# Patient Record
Sex: Female | Born: 1995 | Race: Black or African American | Hispanic: No | Marital: Married | Smoking: Never smoker
Health system: Southern US, Community
[De-identification: ages and names within clinical notes are randomized; demographics above are authoritative.]

## PROBLEM LIST (undated history)

## (undated) ENCOUNTER — Emergency Department (HOSPITAL_BASED_OUTPATIENT_CLINIC_OR_DEPARTMENT_OTHER): Admission: EM | Payer: BLUE CROSS/BLUE SHIELD | Source: Home / Self Care

## (undated) DIAGNOSIS — K219 Gastro-esophageal reflux disease without esophagitis: Secondary | ICD-10-CM

## (undated) DIAGNOSIS — I10 Essential (primary) hypertension: Secondary | ICD-10-CM

---
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
## 2020-12-06 ENCOUNTER — Ambulatory Visit: Payer: 59 | Admitting: Obstetrics & Gynecology

## 2020-12-26 ENCOUNTER — Other Ambulatory Visit: Payer: Self-pay | Admitting: Surgery

## 2020-12-26 DIAGNOSIS — R6889 Other general symptoms and signs: Secondary | ICD-10-CM

## 2021-01-22 ENCOUNTER — Other Ambulatory Visit: Payer: Self-pay

## 2021-01-24 ENCOUNTER — Ambulatory Visit: Payer: Self-pay | Admitting: Medical

## 2021-01-31 ENCOUNTER — Other Ambulatory Visit: Payer: Self-pay

## 2021-02-05 ENCOUNTER — Ambulatory Visit: Payer: Self-pay | Admitting: Obstetrics and Gynecology

## 2021-02-05 ENCOUNTER — Encounter: Payer: Self-pay | Admitting: Obstetrics and Gynecology

## 2021-02-06 NOTE — Progress Notes (Signed)
Patient did not keep her new GYN referral appointment for 02/05/2021.  Cornelia Copa MD Attending Center for [employer] [facility])

## 2022-01-15 ENCOUNTER — Other Ambulatory Visit: Payer: Self-pay

## 2022-01-15 ENCOUNTER — Emergency Department (HOSPITAL_COMMUNITY)
Admission: EM | Admit: 2022-01-15 | Discharge: 2022-01-15 | Disposition: A | Discharge status: Home / Self Care | Payer: 59 | Attending: Emergency Medicine | Admitting: Emergency Medicine

## 2022-01-15 ENCOUNTER — Encounter (HOSPITAL_COMMUNITY): Payer: Self-pay | Admitting: Pharmacy Technician

## 2022-01-15 DIAGNOSIS — E669 Obesity, unspecified: Secondary | ICD-10-CM | POA: Diagnosis not present

## 2022-01-15 DIAGNOSIS — R1905 Periumbilic swelling, mass or lump: Secondary | ICD-10-CM | POA: Insufficient documentation

## 2022-01-15 NOTE — Discharge Instructions (Signed)
Follow-up with your primary care provider  Return for new or worsening symptoms 

## 2022-01-15 NOTE — ED Triage Notes (Signed)
Pt here POV with reports of knot at the top of her umbilicus. Pt denies pain to area.  ?

## 2022-01-15 NOTE — ED Provider Notes (Signed)
?[facility] ?Provider Note ? ? ?CSN: [id number] ?Arrival date & time: 01/15/22  1332 ? ?  ?History ? ?Possible umbilical mass ? ?Lindsey PacasCynthia M Mora is a 26 y.o. female with no significant past medical history here for evaluation of possible mass in her umbilical area.  Patient states areas been there for 1-1/2 to 2 years.  Seen by PCP for this in January.  Thought was benign per patient. She took some antibiotics for some slight oozing to area and her symptoms resolved. "Looks like a pimple." She has no current symptoms however wanted it "checked out."  No prior abdominal surgeries, no history of hernias.  No fever, abdominal pain, redness, warmth, fluctuance, induration, abdominal masses. Passing flatus with normal BM. ? ?HPI ? ?  ? ?Home Medications ?Prior to Admission medications   ?Medication Sig Start Date End Date Taking? Authorizing Provider  ?acetaminophen (TYLENOL) 500 MG tablet Take 1,000 mg by mouth daily as needed for mild pain.   Yes [provider]  ?ibuprofen (ADVIL) 200 MG tablet Take 400 mg by mouth daily as needed for mild pain.   Yes [provider]  ?Multiple Vitamins-Minerals (EMERGEN-C VITAMIN C PO) Take 1 packet by mouth daily.   Yes [provider]  ?   ? ?Allergies    ?Patient has no active allergies.   ? ?Review of Systems   ?Review of Systems  ?Constitutional: Negative.   ?HENT: Negative.    ?Respiratory: Negative.    ?Cardiovascular: Negative.   ?Gastrointestinal:   ?     Abd mass  ?Genitourinary: Negative.   ?Musculoskeletal: Negative.   ?Neurological: Negative.   ?All other systems reviewed and are negative. ? ?Physical Exam ?Updated Vital Signs ?BP (!) 143/97 (BP Location: Right Arm)   Pulse 94   Temp 98.9 ?F (37.2 ?C) (Oral)   Resp 18   SpO2 99%  ?Physical Exam ?Vitals and nursing note reviewed.  ?Constitutional:   ?   General: She is not in acute distress. ?   Appearance: She is well-developed. She is obese. She is not  ill-appearing, toxic-appearing or diaphoretic.  ?HENT:  ?   Head: Normocephalic and atraumatic.  ?   Nose: Nose normal.  ?   Mouth/Throat:  ?   Mouth: Mucous membranes are moist.  ?Eyes:  ?   Pupils: Pupils are equal, round, and reactive to light.  ?Cardiovascular:  ?   Rate and Rhythm: Normal rate.  ?   Pulses: Normal pulses.  ?Pulmonary:  ?   Effort: Pulmonary effort is normal. No respiratory distress.  ?   Breath sounds: Normal breath sounds.  ?Abdominal:  ?   General: Bowel sounds are normal. There is no distension.  ?   Palpations: Abdomen is soft.  ?   Tenderness: There is no abdominal tenderness. There is no right CVA tenderness, left CVA tenderness, guarding or rebound.  ?   Hernia: No hernia is present.  ?   Comments: Difficult exam secondary to body habitus.  Abdomen soft, nontender.  She has no obvious hernia, specifically umbilical hernia.  On the very anterior superior aspect of her umbilicus patient has what appears to be blackhead. No fluctuance, induration, abscess.  No overlying erythema, warmth.  ?Musculoskeletal:     ?   General: Normal range of motion.  ?   Cervical back: Normal range of motion.  ?Skin: ?   General: Skin is warm and dry.  ?   Capillary Refill: Capillary refill takes  less than 2 seconds.  ?Neurological:  ?   General: No focal deficit present.  ?   Mental Status: She is alert.  ?Psychiatric:     ?   Mood and Affect: Mood normal.  ? ? ?ED Results / Procedures / Treatments   ?Labs ?(all labs ordered are listed, but only abnormal results are displayed) ?Labs Reviewed - No data to display ? ?EKG ?None ? ?Radiology ?No results found. ? ?Procedures ?Procedures  ? ? ?Medications Ordered in ED ?Medications - No data to display ? ?ED Course/ Medical Decision Making/ A&P ?  ? ?26 year old here for evaluation of possible mass on the inside superior aspect umbilicus.  Mass has been present for approx 1.5-2 years. Unchanged per patient  She is afebrile, nonseptic, not ill-appearing.  Was seen  by PCP in January who did not think this was mush.  Patient states was a "pimple" like lesion and took leftover abx from a friend, symptoms resolved.  She came here today for further evaluation.  Her abdomen is soft, nontender.  She has no obvious hernia, abscess, cellulitis on exam.  She does have approximately 4 mm area what appears to be a blackhead on the inside of her umbilicus.  No obvious drainage.  No systemic symptoms. ? ?I discussed the exam with patient.  At this time I low suspicion for hernia, abscess, infectious process, acute intra-abdominal process.  I did offer labs, CT imaging to get better imaging given body habitus, patient declined and states she does not have time for this today. I encouraged FU with PCP for return if her sx return however given she is currently asymptomatic and reassuring exam with sx for over 1 year I feel this is reasonable to FU outpatient. ? ?Patient does not meet the SIRS or Sepsis criteria.  On repeat exam patient does not have a surgical abdomin and there are no peritoneal signs.  No indication of appendicitis, bowel obstruction, bowel perforation, cholecystitis, diverticulitis, PID or ectopic pregnancy.   ?  ?The patient has been appropriately medically screened and/or stabilized in the ED. I have low suspicion for any other emergent medical condition which would require further screening, evaluation or treatment in the ED or require inpatient management. ? ?Patient is hemodynamically stable and in no acute distress.  Patient able to ambulate in department prior to ED.  Evaluation does not show acute pathology that would require ongoing or additional emergent interventions while in the emergency department or further inpatient treatment.  I have discussed the diagnosis with the patient and answered all questions.  Pain is been managed while in the emergency department and patient has no further complaints prior to discharge.  Patient is comfortable with plan discussed  in room and is stable for discharge at this time.  I have discussed strict return precautions for returning to the emergency department.  Patient was encouraged to follow-up with PCP/specialist refer to at discharge.  ? ?                        ?Medical Decision Making ?Amount and/or Complexity of Data Reviewed ?External Data Reviewed: labs, radiology and notes. ? ?Risk ?OTC drugs. ?Prescription drug management. ?Diagnosis or treatment significantly limited by social determinants of health. ? ? ? ? ? ? ? ? ?Final Clinical Impression(s) / ED Diagnoses ?Final diagnoses:  ?Periumbilical mass  ? ? ?Rx / DC Orders ?ED Discharge Orders   ? ? None  ? ?  ? ? ?  ?  Modesta Sammons A, PA-C ?01/15/22 1554 ? ?  ?Melene Plan, DO ?01/15/22 1901 ? ?

## 2022-01-15 NOTE — ED Provider Triage Note (Signed)
Emergency Medicine Provider Triage Evaluation Note ? ?Lindsey Mora , a 26 y.o. female  was evaluated in triage.  Pt complains of possible abscess/cyst in umbilicus.  Patient states that for the past year or so she has had a lesion into the top of her "bellybutton "that has occasionally drained purulent material.  She has been prescribed antibiotics in the past but felt it did not help.  She recently traveled to visit her aunt and took antibiotics that her aunt has leftover and felt that they helped.  No drainage at this time.  ? ?Review of Systems  ?Positive: Abscess/cyst ?Negative: Abdominal pain ? ?Physical Exam  ?BP (!) 143/97 (BP Location: Right Arm)   Pulse 94   Temp 98.9 ?F (37.2 ?C) (Oral)   Resp 18   SpO2 99%  ?Gen:   Awake, no distress   ?Resp:  Normal effort  ?MSK:   Moves extremities without difficulty  ?Other:  No obvious lesion ? ?Medical Decision Making  ?Medically screening exam initiated at 1:48 PM.  Appropriate orders placed.  CATHY CROUNSE was informed that the remainder of the evaluation will be completed by another provider, this initial triage assessment does not replace that evaluation, and the importance of remaining in the ED until their evaluation is complete. ? ? ?  ?Darrick Grinder, PA-C ?01/15/22 1349 ? ?

## 2022-01-20 ENCOUNTER — Ambulatory Visit (HOSPITAL_COMMUNITY)
Admission: EM | Admit: 2022-01-20 | Discharge: 2022-01-20 | Disposition: A | Discharge status: Home / Self Care | Payer: 59 | Attending: Emergency Medicine | Admitting: Emergency Medicine

## 2022-01-20 ENCOUNTER — Encounter (HOSPITAL_COMMUNITY): Payer: Self-pay | Admitting: Emergency Medicine

## 2022-01-20 DIAGNOSIS — Z202 Contact with and (suspected) exposure to infections with a predominantly sexual mode of transmission: Secondary | ICD-10-CM

## 2022-01-20 DIAGNOSIS — Z113 Encounter for screening for infections with a predominantly sexual mode of transmission: Secondary | ICD-10-CM | POA: Diagnosis present

## 2022-01-20 LAB — HIV ANTIBODY (ROUTINE TESTING W REFLEX): HIV Screen 4th Generation wRfx: NONREACTIVE

## 2022-01-20 NOTE — ED Triage Notes (Signed)
Pt is present today with concerns for STD. Pt states that she would also like to be tested for HIV and syphilis  ?

## 2022-01-20 NOTE — Discharge Instructions (Signed)
You will get a call if tests are positive, you will not get a call if tests are negative but you can check results in MyChart if you have a MyChart account.   

## 2022-01-20 NOTE — ED Provider Notes (Signed)
?[facility] ? ? ? ?CSN: [id number] ?Arrival date & time: 01/20/22  1234 ? ? ?  ? ?History   ?Chief Complaint ?Chief Complaint  ?Patient presents with  ? SEXUALLY TRANSMITTED DISEASE  ? ? ?HPI ?Lindsey Mora is a 26 y.o. female.  She is here requesting STI testing including syphilis and HIV.  Patient reports she has not been sexually active for 3 years but after her previous relationship and did not, never underwent STI screening.  Denies any active symptoms.  Last menstrual period was 2 months ago patient reports that is not unusual for her and they are always irregular.  Patient denies sexual activity for 3 years ? ?HPI ? ?History reviewed. No pertinent past medical history. ? ?There are no problems to display for this patient. ? ? ?History reviewed. No pertinent surgical history. ? ?OB History   ?No obstetric history on file. ?  ? ? ? ?Home Medications   ? ?Prior to Admission medications   ?Medication Sig Start Date End Date Taking? Authorizing Provider  ?acetaminophen (TYLENOL) 500 MG tablet Take 1,000 mg by mouth daily as needed for mild pain.    [provider]  ?ibuprofen (ADVIL) 200 MG tablet Take 400 mg by mouth daily as needed for mild pain.    [provider]  ?Multiple Vitamins-Minerals (EMERGEN-C VITAMIN C PO) Take 1 packet by mouth daily.    [provider]  ? ? ?Family History ?History reviewed. No pertinent family history. ? ?Social History ?Social History  ? ?Tobacco Use  ? Smoking status: Never  ?Substance Use Topics  ? Alcohol use: No  ? Drug use: No  ? ? ? ?Allergies   ?Patient has no known allergies. ? ? ?Review of Systems ?Review of Systems  ?Gastrointestinal:  Negative for abdominal pain, nausea and vomiting.  ?Genitourinary:  Negative for dysuria, flank pain, genital sores, hematuria, vaginal bleeding and vaginal discharge.  ? ? ?Physical Exam ?Triage Vital Signs ?ED Triage Vitals  ?Enc Vitals Group  ?   BP 01/20/22 1326 135/82  ?   Pulse Rate 01/20/22  1326 96  ?   Resp 01/20/22 1326 18  ?   Temp 01/20/22 1326 98.7 ?F (37.1 ?C)  ?   Temp Source 01/20/22 1326 Oral  ?   SpO2 01/20/22 1326 98 %  ?   Weight --   ?   Height --   ?   Head Circumference --   ?   Peak Flow --   ?   Pain Score 01/20/22 1325 0  ?   Pain Loc --   ?   Pain Edu? --   ?   Excl. in GC? --   ? ?No data found. ? ?Updated Vital Signs ?BP 135/82   Pulse 96   Temp 98.7 ?F (37.1 ?C) (Oral)   Resp 18   LMP 12/01/2021 (Approximate)   SpO2 98%  ? ?Visual Acuity ?Right Eye Distance:   ?Left Eye Distance:   ?Bilateral Distance:   ? ?Right Eye Near:   ?Left Eye Near:    ?Bilateral Near:    ? ?Physical Exam ?Constitutional:   ?   General: She is not in acute distress. ?   Appearance: Normal appearance. She is not ill-appearing.  ?Pulmonary:  ?   Effort: Pulmonary effort is normal.  ?Neurological:  ?   Mental Status: She is alert.  ? ? ? ?UC Treatments / Results  ?Labs ?(all labs ordered are listed, but only  abnormal results are displayed) ?Labs Reviewed  ?RPR  ?HIV ANTIBODY (ROUTINE TESTING W REFLEX)  ?CERVICOVAGINAL ANCILLARY ONLY  ? ? ?EKG ? ? ?Radiology ?No results found. ? ?Procedures ?Procedures (including critical care time) ? ?Medications Ordered in UC ?Medications - No data to display ? ?Initial Impression / Assessment and Plan / UC Course  ?I have reviewed the triage vital signs and the nursing notes. ? ?Pertinent labs & imaging results that were available during my care of the patient were reviewed by me and considered in my medical decision making (see chart for details). ? ?  ?STI testing as requested. ? ?Final Clinical Impressions(s) / UC Diagnoses  ? ?Final diagnoses:  ?Routine screening for STI (sexually transmitted infection)  ? ? ? ?Discharge Instructions   ? ?  ?You will get a call if tests are positive, you will not get a call if tests are negative but you can check results in MyChart if you have a MyChart account.  ? ? ? ?ED Prescriptions   ?None ?  ? ?PDMP not reviewed this  encounter. ?  ?Cathlyn Parsons, NP ?01/20/22 1416 ? ?

## 2022-01-21 ENCOUNTER — Telehealth (HOSPITAL_COMMUNITY): Payer: Self-pay | Admitting: Emergency Medicine

## 2022-01-21 LAB — RPR: RPR Ser Ql: NONREACTIVE

## 2022-01-21 LAB — CERVICOVAGINAL ANCILLARY ONLY
Bacterial Vaginitis (gardnerella): POSITIVE — AB
Candida Glabrata: NEGATIVE
Candida Vaginitis: NEGATIVE
Chlamydia: NEGATIVE
Comment: NEGATIVE
Comment: NEGATIVE
Comment: NEGATIVE
Comment: NEGATIVE
Comment: NEGATIVE
Comment: NORMAL
Neisseria Gonorrhea: NEGATIVE
Trichomonas: NEGATIVE

## 2022-01-21 MED ORDER — METRONIDAZOLE 500 MG PO TABS: 500.0000 mg | ORAL_TABLET | Freq: Two times a day (BID) | ORAL | 0 refills | Status: DC

## 2022-02-24 ENCOUNTER — Encounter (HOSPITAL_COMMUNITY): Payer: Self-pay

## 2022-02-24 ENCOUNTER — Ambulatory Visit (HOSPITAL_COMMUNITY)
Admission: EM | Admit: 2022-02-24 | Discharge: 2022-02-24 | Disposition: A | Discharge status: Home / Self Care | Payer: 59 | Attending: Family Medicine | Admitting: Family Medicine

## 2022-02-24 DIAGNOSIS — J029 Acute pharyngitis, unspecified: Secondary | ICD-10-CM

## 2022-02-24 DIAGNOSIS — J069 Acute upper respiratory infection, unspecified: Secondary | ICD-10-CM | POA: Diagnosis not present

## 2022-02-24 MED ORDER — LIDOCAINE VISCOUS HCL 2 % MT SOLN: 5.0000 mL | Freq: Four times a day (QID) | OROMUCOSAL | 0 refills | Status: AC | PRN

## 2022-02-24 MED ORDER — PROMETHAZINE-DM 6.25-15 MG/5ML PO SYRP: 5.0000 mL | ORAL_SOLUTION | Freq: Four times a day (QID) | ORAL | 0 refills | Status: AC | PRN

## 2022-02-24 NOTE — ED Triage Notes (Signed)
Pt c/o sore throat and bilateral ear pain x1 week. States now has a cough and congestion. Denies fever. States gargling salt water and drinking hot tea with no relies.

## 2022-02-24 NOTE — ED Provider Notes (Signed)
  [facility]   [id number] 02/24/22 Arrival Time: 6761  ASSESSMENT & PLAN:  1. Sore throat   2. Viral URI with cough    No signs of peritonsillar abscess or bacterial infection. Discussed.  Meds ordered this encounter  Medications   magic mouthwash (lidocaine, diphenhydrAMINE, alum & mag hydroxide) suspension    Sig: Swish and spit 5 mLs 4 (four) times daily as needed for mouth pain.    Dispense:  360 mL    Refill:  0   promethazine-dextromethorphan (PROMETHAZINE-DM) 6.25-15 MG/5ML syrup    Sig: Take 5 mLs by mouth 4 (four) times daily as needed for cough.    Dispense:  118 mL    Refill:  0    OTC analgesics and throat care as needed  Follow-up Information     [facility].   Specialty: Urgent Care Why: If worsening or failing to improve as anticipated. Contact information: [address] [place] [address] [phone]                 Reviewed expectations re: course of current medical issues. Questions answered. Outlined signs and symptoms indicating need for more acute intervention. Patient verbalized understanding. After Visit Summary given.   SUBJECTIVE:  Lindsey Mora is a 26 y.o. female who reports a sore throat. Onset one week. Symptoms have progressed to a point and plateaued since beginning; without voice changes. With a cough now. Normal PO intake but reports discomfort with swallowing. No specific alleviating factors. Fever: absent. No neck pain or swelling. No associated nausea, vomiting, or abdominal pain. Known sick contacts: none. Recent travel: none. OTC treatment: various without relief.   OBJECTIVE:  Vitals:   02/24/22 1004  BP: (!) 149/96  Pulse: 84  Resp: 18  Temp: 98.4 F (36.9 C)  TempSrc: Oral  SpO2: 98%    General appearance: alert; no distress HEENT: throat with mild erythema and cobblestoning; uvula is midline Neck: supple with FROM; no lymphadenopathy Lungs:  speaks full sentences without difficulty; unlabored Abd: soft; non-tender Skin: reveals no rash; warm and dry Psychological: alert and cooperative; normal mood and affect  No Known Allergies  History reviewed. No pertinent past medical history. Social History   Socioeconomic History   Marital status: Married    Spouse name: Not on file   Number of children: Not on file   Years of education: Not on file   Highest education level: Not on file  Occupational History   Not on file  Tobacco Use   Smoking status: Never   Smokeless tobacco: Not on file  Substance and Sexual Activity   Alcohol use: No   Drug use: No   Sexual activity: Yes    Birth control/protection: None  Other Topics Concern   Not on file  Social History Narrative   Not on file   Social Determinants of Health   Financial Resource Strain: Not on file  Food Insecurity: Not on file  Transportation Needs: Not on file  Physical Activity: Not on file  Stress: Not on file  Social Connections: Not on file  Intimate Partner Violence: Not on file   History reviewed. No pertinent family history.         Mardella Layman, MD 02/24/22 1022

## 2022-02-26 ENCOUNTER — Telehealth: Payer: 59 | Admitting: Physician Assistant

## 2022-02-26 DIAGNOSIS — J029 Acute pharyngitis, unspecified: Secondary | ICD-10-CM

## 2022-02-26 MED ORDER — PREDNISONE 10 MG (21) PO TBPK: ORAL_TABLET | ORAL | 0 refills | Status: AC

## 2022-02-26 NOTE — Progress Notes (Signed)
Virtual Visit Consent   Lindsey Mora, you are scheduled for a virtual visit with a [facility] provider today. Just as with appointments in the office, your consent must be obtained to participate. Your consent will be active for this visit and any virtual visit you may have with one of our providers in the next 365 days. If you have a MyChart account, a copy of this consent can be sent to you electronically.  As this is a virtual visit, video technology does not allow for your provider to perform a traditional examination. This may limit your provider's ability to fully assess your condition. If your provider identifies any concerns that need to be evaluated in person or the need to arrange testing (such as labs, EKG, etc.), we will make arrangements to do so. Although advances in technology are sophisticated, we cannot ensure that it will always work on either your end or our end. If the connection with a video visit is poor, the visit may have to be switched to a telephone visit. With either a video or telephone visit, we are not always able to ensure that we have a secure connection.  By engaging in this virtual visit, you consent to the provision of healthcare and authorize for your insurance to be billed (if applicable) for the services provided during this visit. Depending on your insurance coverage, you may receive a charge related to this service.  I need to obtain your verbal consent now. Are you willing to proceed with your visit today? Lindsey Mora has provided verbal consent on 02/26/2022 for a virtual visit (video or telephone). Piedad Climes, [place]  Date: 02/26/2022 6:17 PM  Virtual Visit via Video Note   I, Piedad Climes, connected with  Lindsey Mora  ([id number], 08-27-1996) on 02/26/22 at  6:30 PM EDT by a video-enabled telemedicine application and verified that I am speaking with the correct person using two identifiers.  Location: Patient: Virtual Visit Location  Patient: Home Provider: Virtual Visit Location Provider: Home Office   I discussed the limitations of evaluation and management by telemedicine and the availability of in person appointments. The patient expressed understanding and agreed to proceed.    History of Present Illness: Lindsey Mora is a 26 y.o. who identifies as a female who was assigned female at birth, and is being seen today for worsening sore throat. Was seen a couple of days ago and diagnosed with viral pharyngitis. Started on Ibuprofen OTC and magic mouthwash. Notes ibuprofen helps but mouthwash does not. Pain comes back when Ibuprofen wears off. Denies fever, chills, patches in her throat. Some mild cough present.   HPI: HPI  Problems: There are no problems to display for this patient.   Allergies: No Known Allergies Medications:  Current Outpatient Medications:    predniSONE (STERAPRED UNI-PAK 21 TAB) 10 MG (21) TBPK tablet, Take following package directions, Disp: 21 tablet, Rfl: 0   acetaminophen (TYLENOL) 500 MG tablet, Take 1,000 mg by mouth daily as needed for mild pain., Disp: , Rfl:    ibuprofen (ADVIL) 200 MG tablet, Take 400 mg by mouth daily as needed for mild pain., Disp: , Rfl:    magic mouthwash (lidocaine, diphenhydrAMINE, alum & mag hydroxide) suspension, Swish and spit 5 mLs 4 (four) times daily as needed for mouth pain., Disp: 360 mL, Rfl: 0   Multiple Vitamins-Minerals (EMERGEN-C VITAMIN C PO), Take 1 packet by mouth daily., Disp: , Rfl:    promethazine-dextromethorphan (PROMETHAZINE-DM) 6.25-15  MG/5ML syrup, Take 5 mLs by mouth 4 (four) times daily as needed for cough., Disp: 118 mL, Rfl: 0  Observations/Objective: Patient is well-developed, well-nourished in no acute distress.  Resting comfortably at home.  Head is normocephalic, atraumatic.  No labored breathing. Speech is clear and coherent with logical content.  Patient is alert and oriented at baseline.  Mild erythema of throat noted without  tonsillar swelling. Uvula is midline.   Assessment and Plan: 1. Viral pharyngitis - predniSONE (STERAPRED UNI-PAK 21 TAB) 10 MG (21) TBPK tablet; Take following package directions  Dispense: 21 tablet; Refill: 0  Continue good hydration. Tylenol OTC. Start Sterapred pack as directed. Can alternate tylenol and ibuprofen as needed once steroid taper complete. Supportive measures and other OTC medications reviewed.   Follow Up Instructions: I discussed the assessment and treatment plan with the patient. The patient was provided an opportunity to ask questions and all were answered. The patient agreed with the plan and demonstrated an understanding of the instructions.  A copy of instructions were sent to the patient via MyChart unless otherwise noted below.   The patient was advised to call back or seek an in-person evaluation if the symptoms worsen or if the condition fails to improve as anticipated.  Time:  I spent 10 minutes with the patient via telehealth technology discussing the above problems/concerns.    Piedad Climes, PA-C

## 2022-02-26 NOTE — Patient Instructions (Signed)
  Lindsey Mora, thank you for joining Piedad Climes, PA-C for today's virtual visit.  While this provider is not your primary care provider (PCP), if your PCP is located in our provider database this encounter information will be shared with them immediately following your visit.  Consent: (Patient) Lindsey Mora provided verbal consent for this virtual visit at the beginning of the encounter.  Current Medications:  Current Outpatient Medications:    acetaminophen (TYLENOL) 500 MG tablet, Take 1,000 mg by mouth daily as needed for mild pain., Disp: , Rfl:    ibuprofen (ADVIL) 200 MG tablet, Take 400 mg by mouth daily as needed for mild pain., Disp: , Rfl:    magic mouthwash (lidocaine, diphenhydrAMINE, alum & mag hydroxide) suspension, Swish and spit 5 mLs 4 (four) times daily as needed for mouth pain., Disp: 360 mL, Rfl: 0   Multiple Vitamins-Minerals (EMERGEN-C VITAMIN C PO), Take 1 packet by mouth daily., Disp: , Rfl:    promethazine-dextromethorphan (PROMETHAZINE-DM) 6.25-15 MG/5ML syrup, Take 5 mLs by mouth 4 (four) times daily as needed for cough., Disp: 118 mL, Rfl: 0   Medications ordered in this encounter:  No orders of the defined types were placed in this encounter.    *If you need refills on other medications prior to your next appointment, please contact your pharmacy*  Follow-Up: Call back or seek an in-person evaluation if the symptoms worsen or if the condition fails to improve as anticipated.  Other Instructions Please keep hydrated and get plenty of rest.  Start the steroid pack as directed. Tylenol can be used throughout the day with this. After finishing the steroid pack -- if needed, you can alternate tylenol and ibuprofen. Continue salt-water gargles and rest. If symptoms are not resolving with this added treatment, I want you re-evaluated in person.   Take care!   If you have been instructed to have an in-person evaluation today at a local Urgent  Care facility, please use the link below. It will take you to a list of all of our available [facility], including address, phone number and hours of operation. Please do not delay care.  [facility]  If you or a family member do not have a primary care provider, use the link below to schedule a visit and establish care. When you choose a [facility] primary care physician or advanced practice provider, you gain a long-term partner in health. Find a Primary Care Provider  Learn more about [facility]'s in-office and virtual care options: [facility] - Get Care Now

## 2022-04-18 ENCOUNTER — Ambulatory Visit (INDEPENDENT_AMBULATORY_CARE_PROVIDER_SITE_OTHER): Payer: Commercial Managed Care - HMO

## 2022-04-18 ENCOUNTER — Ambulatory Visit
Admission: EM | Admit: 2022-04-18 | Discharge: 2022-04-18 | Disposition: A | Discharge status: Home / Self Care | Payer: Commercial Managed Care - HMO | Attending: Physician Assistant | Admitting: Physician Assistant

## 2022-04-18 DIAGNOSIS — R0789 Other chest pain: Secondary | ICD-10-CM | POA: Diagnosis not present

## 2022-04-18 DIAGNOSIS — R079 Chest pain, unspecified: Secondary | ICD-10-CM | POA: Diagnosis not present

## 2022-04-18 MED ORDER — OMEPRAZOLE 20 MG PO CPDR: 20.0000 mg | DELAYED_RELEASE_CAPSULE | Freq: Every day | ORAL | 0 refills | Status: AC

## 2022-04-18 NOTE — ED Provider Notes (Signed)
[facility]    CSN: [id number] Arrival date & time: 04/18/22  1348      History   Chief Complaint Chief Complaint  Patient presents with   Chest Pain   Panic Attack    HPI Lindsey Mora is a 26 y.o. female.   Patient is here today for evaluation of left sided chest pain that radiates into her left shoulder and back at times. She reports symptoms started recently and she had panic attack yesterday. Symptoms do seem to be related to diet at times. She denies any shortness of breath. She has not taken any medication for symptoms. She does not currently have PCP and anxiety/ panic is untreated. She does not report fever. She does report some left leg pain.   The history is provided by the patient.    History reviewed. No pertinent past medical history.  There are no problems to display for this patient.   History reviewed. No pertinent surgical history.  OB History   No obstetric history on file.      Home Medications    Prior to Admission medications   Medication Sig Start Date End Date Taking? Authorizing Provider  omeprazole (PRILOSEC) 20 MG capsule Take 1 capsule (20 mg total) by mouth daily. 04/18/22  Yes Tomi Bamberger, PA-C  acetaminophen (TYLENOL) 500 MG tablet Take 1,000 mg by mouth daily as needed for mild pain.    [provider]  ibuprofen (ADVIL) 200 MG tablet Take 400 mg by mouth daily as needed for mild pain.    [provider]  magic mouthwash (lidocaine, diphenhydrAMINE, alum & mag hydroxide) suspension Swish and spit 5 mLs 4 (four) times daily as needed for mouth pain. 02/24/22   Mardella Layman, MD  Multiple Vitamins-Minerals (EMERGEN-C VITAMIN C PO) Take 1 packet by mouth daily.    [provider]  predniSONE (STERAPRED UNI-PAK 21 TAB) 10 MG (21) TBPK tablet Take following package directions 02/26/22   Waldon Merl, PA-C  promethazine-dextromethorphan (PROMETHAZINE-DM) 6.25-15 MG/5ML syrup Take 5 mLs by mouth 4  (four) times daily as needed for cough. 02/24/22   Mardella Layman, MD    Family History History reviewed. No pertinent family history.  Social History Social History   Tobacco Use   Smoking status: Never  Substance Use Topics   Alcohol use: No   Drug use: No     Allergies   Patient has no known allergies.   Review of Systems Review of Systems  Constitutional:  Negative for chills and fever.  Eyes:  Negative for discharge and redness.  Respiratory:  Negative for shortness of breath.   Cardiovascular:  Positive for chest pain.  Gastrointestinal:  Negative for nausea and vomiting.     Physical Exam Triage Vital Signs ED Triage Vitals  Enc Vitals Group     BP      Pulse      Resp      Temp      Temp src      SpO2      Weight      Height      Head Circumference      Peak Flow      Pain Score      Pain Loc      Pain Edu?      Excl. in GC?    No data found.  Updated Vital Signs BP 116/88 (BP Location: Left Arm)   Pulse 97   Temp 97.9 F (  36.6 C) (Oral)   Resp 18   SpO2 97%      Physical Exam Vitals and nursing note reviewed.  Constitutional:      General: She is not in acute distress.    Appearance: Normal appearance. She is obese. She is not ill-appearing, toxic-appearing or diaphoretic.  HENT:     Head: Normocephalic and atraumatic.  Eyes:     Conjunctiva/sclera: Conjunctivae normal.  Cardiovascular:     Rate and Rhythm: Normal rate and regular rhythm.     Heart sounds: Normal heart sounds. No murmur heard. Pulmonary:     Effort: Pulmonary effort is normal. No respiratory distress.     Breath sounds: Normal breath sounds. No wheezing, rhonchi or rales.  Neurological:     Mental Status: She is alert.  Psychiatric:        Mood and Affect: Mood normal.        Behavior: Behavior normal.      UC Treatments / Results  Labs (all labs ordered are listed, but only abnormal results are displayed) Labs Reviewed  CBC WITH DIFFERENTIAL/PLATELET   COMPREHENSIVE METABOLIC PANEL  H. PYLORI ANTIBODY, IGG    EKG   Radiology DG Chest 2 View  Result Date: 04/18/2022 CLINICAL DATA:  Chest pain, anxiety EXAM: CHEST - 2 VIEW COMPARISON:  None Available. FINDINGS: The heart size and mediastinal contours are within normal limits. Both lungs are clear. The visualized skeletal structures are unremarkable. IMPRESSION: No active cardiopulmonary disease. Electronically Signed   By: Ernie Avena M.D.   On: 04/18/2022 14:47    Procedures Procedures (including critical care time)  Medications Ordered in UC Medications - No data to display  Initial Impression / Assessment and Plan / UC Course  I have reviewed the triage vital signs and the nursing notes.  Pertinent labs & imaging results that were available during my care of the patient were reviewed by me and considered in my medical decision making (see chart for details).    EKG with NSR. CXR ordered and clear. Discussed possiblity of panic being cause of symptoms vs. GERD, gastric ulcer, other etiology. Will trial PPI and set up patient with PCP before leaving office as I suspect she will benefit from continuity of care.  Final Clinical Impressions(s) / UC Diagnoses   Final diagnoses:  Chest discomfort   Discharge Instructions   None    ED Prescriptions     Medication Sig Dispense Auth. Provider   omeprazole (PRILOSEC) 20 MG capsule Take 1 capsule (20 mg total) by mouth daily. 30 capsule Tomi Bamberger, PA-C      PDMP not reviewed this encounter.   Tomi Bamberger, PA-C 04/18/22 1524

## 2022-04-18 NOTE — ED Triage Notes (Signed)
Pt c/o chest pain and right leg pain x 2 days. She does report history of anxiety.

## 2022-04-20 LAB — CBC WITH DIFFERENTIAL/PLATELET
Basophils Absolute: 0 10*3/uL (ref 0.0–0.2)
Basos: 0 %
EOS (ABSOLUTE): 0.1 10*3/uL (ref 0.0–0.4)
Eos: 1 %
Hematocrit: 37.1 % (ref 34.0–46.6)
Hemoglobin: 11.7 g/dL (ref 11.1–15.9)
Immature Grans (Abs): 0 10*3/uL (ref 0.0–0.1)
Immature Granulocytes: 0 %
Lymphocytes Absolute: 3.9 10*3/uL — ABNORMAL HIGH (ref 0.7–3.1)
Lymphs: 37 %
MCH: 25.9 pg — ABNORMAL LOW (ref 26.6–33.0)
MCHC: 31.5 g/dL (ref 31.5–35.7)
MCV: 82 fL (ref 79–97)
Monocytes Absolute: 0.5 10*3/uL (ref 0.1–0.9)
Monocytes: 5 %
Neutrophils Absolute: 6.1 10*3/uL (ref 1.4–7.0)
Neutrophils: 57 %
Platelets: 303 10*3/uL (ref 150–450)
RBC: 4.52 x10E6/uL (ref 3.77–5.28)
RDW: 14.5 % (ref 11.7–15.4)
WBC: 10.7 10*3/uL (ref 3.4–10.8)

## 2022-04-20 LAB — COMPREHENSIVE METABOLIC PANEL
ALT: 17 IU/L (ref 0–32)
AST: 19 IU/L (ref 0–40)
Albumin/Globulin Ratio: 1.1 — ABNORMAL LOW (ref 1.2–2.2)
Albumin: 4.2 g/dL (ref 4.0–5.0)
Alkaline Phosphatase: 83 IU/L (ref 44–121)
BUN/Creatinine Ratio: 13 (ref 9–23)
BUN: 10 mg/dL (ref 6–20)
Bilirubin Total: 0.3 mg/dL (ref 0.0–1.2)
CO2: 22 mmol/L (ref 20–29)
Calcium: 9.2 mg/dL (ref 8.7–10.2)
Chloride: 104 mmol/L (ref 96–106)
Creatinine, Ser: 0.75 mg/dL (ref 0.57–1.00)
Globulin, Total: 3.8 g/dL (ref 1.5–4.5)
Glucose: 75 mg/dL (ref 70–99)
Potassium: 4.3 mmol/L (ref 3.5–5.2)
Sodium: 140 mmol/L (ref 134–144)
Total Protein: 8 g/dL (ref 6.0–8.5)
eGFR: 113 mL/min/{1.73_m2} (ref >59)

## 2022-04-20 LAB — H. PYLORI ANTIBODY, IGG: H. pylori, IgG AbS: 3.61 Index Value — ABNORMAL HIGH (ref 0.00–0.79)

## 2022-04-22 ENCOUNTER — Telehealth: Payer: Self-pay

## 2022-04-22 ENCOUNTER — Telehealth: Payer: Self-pay | Admitting: Physician Assistant

## 2022-04-22 MED ORDER — CLARITHROMYCIN 500 MG PO TABS: 500.0000 mg | ORAL_TABLET | Freq: Two times a day (BID) | ORAL | 0 refills | Status: DC

## 2022-04-22 MED ORDER — CLARITHROMYCIN 500 MG PO TABS: 500.0000 mg | ORAL_TABLET | Freq: Two times a day (BID) | ORAL | 0 refills | Status: AC

## 2022-04-22 MED ORDER — AMOXICILLIN 500 MG PO CAPS: 500.0000 mg | ORAL_CAPSULE | Freq: Two times a day (BID) | ORAL | 0 refills | Status: AC

## 2022-04-22 MED ORDER — AMOXICILLIN 500 MG PO CAPS: 500.0000 mg | ORAL_CAPSULE | Freq: Two times a day (BID) | ORAL | 0 refills | Status: DC

## 2022-04-22 NOTE — Telephone Encounter (Signed)
Treatment prescribed for H.Pylori.

## 2022-05-27 ENCOUNTER — Ambulatory Visit
Admission: EM | Admit: 2022-05-27 | Discharge: 2022-05-27 | Disposition: A | Discharge status: Other Acute Inpatient Hospital | Payer: 59

## 2022-10-02 ENCOUNTER — Ambulatory Visit
Admission: EM | Admit: 2022-10-02 | Discharge: 2022-10-02 | Disposition: A | Discharge status: Home / Self Care | Payer: Commercial Managed Care - HMO

## 2022-10-02 DIAGNOSIS — R519 Headache, unspecified: Secondary | ICD-10-CM

## 2022-10-02 LAB — POCT FASTING CBG KUC MANUAL ENTRY: POCT Glucose (KUC): 75 mg/dL (ref 70–99)

## 2022-10-02 MED ORDER — NAPROXEN 375 MG PO TABS: 375.0000 mg | ORAL_TABLET | Freq: Two times a day (BID) | ORAL | 0 refills | Status: AC

## 2022-10-02 NOTE — ED Provider Notes (Addendum)
[facility]    CSN: [id number] Arrival date & time: 10/02/22  0957      History   Chief Complaint Chief Complaint  Patient presents with   Headache    HPI Lindsey Mora is a 27 y.o. female Lindsey Mora is a 27 y.o. female who presents for evaluation of a accidental/temporal headache x 1 week.  Headache is intermittent and does resolve with Tylenol.  Reports remote history of headaches in the past that been associated with increased sugar intake.  States she is not diabetic.  Was recently started on losartan and amlodipine for blood pressure 1 month ago.  No history of migraines.  Her mother does have a history of migraines.  She reports she staying hydrated.  Does report stress as well.  Associated symptoms: None  Alleviating factors: tylenol  Aggravating factors: Eating more sugar and working on the computer  Worst HA of life: No. Rates pain as a 3/10  First degree relative of SAH: No    Headache   History reviewed. No pertinent past medical history.  There are no problems to display for this patient.   History reviewed. No pertinent surgical history.  OB History   No obstetric history on file.      Home Medications    Prior to Admission medications   Medication Sig Start Date End Date Taking? Authorizing Provider  amLODipine (NORVASC) 5 MG tablet 5 mg. 09/09/22  Yes [provider]  losartan (COZAAR) 25 MG tablet 25 mg. 09/10/22  Yes [provider]  naproxen (NAPROSYN) 375 MG tablet Take 1 tablet (375 mg total) by mouth 2 (two) times daily. 10/02/22  Yes Melynda Ripple, NP  acetaminophen (TYLENOL) 500 MG tablet Take 1,000 mg by mouth daily as needed for mild pain.    [provider]  ibuprofen (ADVIL) 200 MG tablet Take 400 mg by mouth daily as needed for mild pain.    [provider]  magic mouthwash (lidocaine, diphenhydrAMINE, alum & mag hydroxide) suspension Swish and spit 5 mLs 4 (four) times daily as  needed for mouth pain. 02/24/22   Vanessa Kick, MD  Multiple Vitamins-Minerals (EMERGEN-C VITAMIN C PO) Take 1 packet by mouth daily.    [provider]  omeprazole (PRILOSEC) 20 MG capsule Take 1 capsule (20 mg total) by mouth daily. 04/18/22   Francene Finders, PA-C  predniSONE (STERAPRED UNI-PAK 21 TAB) 10 MG (21) TBPK tablet Take following package directions 02/26/22   Brunetta Jeans, PA-C  promethazine-dextromethorphan (PROMETHAZINE-DM) 6.25-15 MG/5ML syrup Take 5 mLs by mouth 4 (four) times daily as needed for cough. 02/24/22   Vanessa Kick, MD    Family History History reviewed. No pertinent family history.  Social History Social History   Tobacco Use   Smoking status: Never  Substance Use Topics   Alcohol use: No   Drug use: No     Allergies   Patient has no known allergies.   Review of Systems Review of Systems  Neurological:  Positive for headaches.     Physical Exam Triage Vital Signs ED Triage Vitals  Enc Vitals Group     BP 10/02/22 1030 120/81     Pulse Rate 10/02/22 1030 81     Resp 10/02/22 1030 19     Temp 10/02/22 1030 97.8 F (36.6 C)     Temp src --      SpO2 10/02/22 1030 98 %     Weight --  Height --      Head Circumference --      Peak Flow --      Pain Score 10/02/22 1028 4     Pain Loc --      Pain Edu? --      Excl. in GC? --    No data found.  Updated Vital Signs BP 120/81   Pulse 81   Temp 97.8 F (36.6 C)   Resp 19   LMP 09/12/2022 (Approximate)   SpO2 98%   Visual Acuity Right Eye Distance:   Left Eye Distance:   Bilateral Distance:    Right Eye Near:   Left Eye Near:    Bilateral Near:     Physical Exam Vitals and nursing note reviewed.  Constitutional:      Appearance: She is well-developed.  HENT:     Head: Normocephalic and atraumatic.     Right Ear: Tympanic membrane and ear canal normal.     Left Ear: Tympanic membrane and ear canal normal.  Eyes:     General: No visual field deficit.     Extraocular Movements: Extraocular movements intact.     Conjunctiva/sclera: Conjunctivae normal.     Pupils: Pupils are equal, round, and reactive to light.  Cardiovascular:     Rate and Rhythm: Normal rate.  Pulmonary:     Effort: Pulmonary effort is normal.  Skin:    General: Skin is warm and dry.  Neurological:     General: No focal deficit present.     Mental Status: She is alert and oriented to person, place, and time.     GCS: GCS eye subscore is 4. GCS verbal subscore is 5. GCS motor subscore is 6.     Cranial Nerves: No cranial nerve deficit or facial asymmetry.     Motor: No weakness or pronator drift.     Coordination: Romberg sign negative. Finger-Nose-Finger Test and Heel to [place] Test normal.     Gait: Gait is intact.  Psychiatric:        Mood and Affect: Mood normal.        Behavior: Behavior normal.      UC Treatments / Results  Labs (all labs ordered are listed, but only abnormal results are displayed) Labs Reviewed  POCT FASTING CBG [facility] MANUAL ENTRY    EKG   Radiology No results found.  Procedures Procedures (including critical care time)  Medications Ordered in UC Medications - No data to display  Initial Impression / Assessment and Plan / UC Course  I have reviewed the triage vital signs and the nursing notes.  Pertinent labs & imaging results that were available during my care of the patient were reviewed by me and considered in my medical decision making (see chart for details).     Reviewed exam and symptoms with patient. No red flags on exam.  Blood pressure and BS  within normal limits Discussed multiple contributing factors of her headaches including hydration, stress, sugar intake, bluelight exposure from computers Advised reducing her sugar intake and getting a bluelight filter for her computer screen Continue OTC Tylenol as needed Rx naproxen as needed Advised headache diary Follow-up with PCP for further evaluation and  treatment Strict ER precautions reviewed and she verbalized understanding  Final Clinical Impressions(s) / UC Diagnoses   Final diagnoses:  Acute nonintractable headache, unspecified headache type     Discharge Instructions      Naproxen as needed for headache You may also continue over-the-counter  Tylenol as needed Decrease your sugar intake Looking to get any blue screen filter for your computer Keep a headache diary Follow-up with your PCP for further treatment options Please go to the emergency room if you have any worsening of his symptoms     ED Prescriptions     Medication Sig Dispense Auth. Provider   naproxen (NAPROSYN) 375 MG tablet Take 1 tablet (375 mg total) by mouth 2 (two) times daily. 20 tablet Melynda Ripple, NP      PDMP not reviewed this encounter.   Melynda Ripple, NP 10/02/22 1116    Melynda Ripple, NP 10/02/22 1122

## 2022-10-02 NOTE — Discharge Instructions (Signed)
Naproxen as needed for headache You may also continue over-the-counter Tylenol as needed Decrease your sugar intake Looking to get any blue screen filter for your computer Keep a headache diary Follow-up with your PCP for further treatment options Please go to the emergency room if you have any worsening of his symptoms

## 2022-10-02 NOTE — ED Triage Notes (Addendum)
Pt presents to uc with co of ha. Pt reports she recently was diagnosed with high bp and started medication. Pt reports she called pharmacy and they told her the ha would not be from the new medications  Losartan and amlodipine. Pt reports using tylenol.   Pt reports ha are worse when at work,. Pt works at a [employer]. Lamonte Sakai is at temples, behnd eyes and back of head.

## 2023-02-26 ENCOUNTER — Emergency Department (HOSPITAL_BASED_OUTPATIENT_CLINIC_OR_DEPARTMENT_OTHER)
Admission: EM | Admit: 2023-02-26 | Discharge: 2023-02-26 | Disposition: A | Discharge status: Home / Self Care | Payer: BLUE CROSS/BLUE SHIELD | Attending: Emergency Medicine | Admitting: Emergency Medicine

## 2023-02-26 ENCOUNTER — Emergency Department (HOSPITAL_BASED_OUTPATIENT_CLINIC_OR_DEPARTMENT_OTHER): Payer: BLUE CROSS/BLUE SHIELD

## 2023-02-26 ENCOUNTER — Other Ambulatory Visit (HOSPITAL_BASED_OUTPATIENT_CLINIC_OR_DEPARTMENT_OTHER): Payer: Self-pay

## 2023-02-26 ENCOUNTER — Ambulatory Visit
Admission: EM | Admit: 2023-02-26 | Discharge: 2023-02-26 | Disposition: A | Discharge status: Home / Self Care | Payer: BLUE CROSS/BLUE SHIELD

## 2023-02-26 DIAGNOSIS — N760 Acute vaginitis: Secondary | ICD-10-CM

## 2023-02-26 DIAGNOSIS — N939 Abnormal uterine and vaginal bleeding, unspecified: Secondary | ICD-10-CM | POA: Diagnosis not present

## 2023-02-26 DIAGNOSIS — R1013 Epigastric pain: Secondary | ICD-10-CM | POA: Diagnosis not present

## 2023-02-26 DIAGNOSIS — B9689 Other specified bacterial agents as the cause of diseases classified elsewhere: Secondary | ICD-10-CM | POA: Insufficient documentation

## 2023-02-26 HISTORY — DX: Essential (primary) hypertension: I10

## 2023-02-26 LAB — WET PREP, GENITAL
Sperm: NONE SEEN
Trich, Wet Prep: NONE SEEN
WBC, Wet Prep HPF POC: <10 (ref <10)
Yeast Wet Prep HPF POC: NONE SEEN

## 2023-02-26 LAB — CBC WITH DIFFERENTIAL/PLATELET
Abs Immature Granulocytes: 0.02 10*3/uL (ref 0.00–0.07)
Basophils Absolute: 0 10*3/uL (ref 0.0–0.1)
Basophils Relative: 0 %
Eosinophils Absolute: 0.1 10*3/uL (ref 0.0–0.5)
Eosinophils Relative: 1 %
HCT: 36.3 % (ref 36.0–46.0)
Hemoglobin: 11.3 g/dL — ABNORMAL LOW (ref 12.0–15.0)
Immature Granulocytes: 0 %
Lymphocytes Relative: 38 %
Lymphs Abs: 3.9 10*3/uL (ref 0.7–4.0)
MCH: 25.5 pg — ABNORMAL LOW (ref 26.0–34.0)
MCHC: 31.1 g/dL (ref 30.0–36.0)
MCV: 81.9 fL (ref 80.0–100.0)
Monocytes Absolute: 0.5 10*3/uL (ref 0.1–1.0)
Monocytes Relative: 5 %
Neutro Abs: 5.7 10*3/uL (ref 1.7–7.7)
Neutrophils Relative %: 56 %
Platelets: 340 10*3/uL (ref 150–400)
RBC: 4.43 MIL/uL (ref 3.87–5.11)
RDW: 14.9 % (ref 11.5–15.5)
WBC: 10.2 10*3/uL (ref 4.0–10.5)
nRBC: 0 % (ref 0.0–0.2)

## 2023-02-26 LAB — COMPREHENSIVE METABOLIC PANEL
ALT: 13 U/L (ref 0–44)
AST: 15 U/L (ref 15–41)
Albumin: 4.2 g/dL (ref 3.5–5.0)
Alkaline Phosphatase: 62 U/L (ref 38–126)
Anion gap: 9 (ref 5–15)
BUN: 10 mg/dL (ref 6–20)
CO2: 24 mmol/L (ref 22–32)
Calcium: 9.3 mg/dL (ref 8.9–10.3)
Chloride: 104 mmol/L (ref 98–111)
Creatinine, Ser: 0.7 mg/dL (ref 0.44–1.00)
GFR, Estimated: >60 mL/min (ref >60)
Glucose, Bld: 86 mg/dL (ref 70–99)
Potassium: 3.9 mmol/L (ref 3.5–5.1)
Sodium: 137 mmol/L (ref 135–145)
Total Bilirubin: 0.4 mg/dL (ref 0.3–1.2)
Total Protein: 8.2 g/dL — ABNORMAL HIGH (ref 6.5–8.1)

## 2023-02-26 LAB — URINALYSIS, ROUTINE W REFLEX MICROSCOPIC
Leukocytes,Ua: NEGATIVE
Nitrite: NEGATIVE

## 2023-02-26 LAB — URINALYSIS, MICROSCOPIC (REFLEX): RBC / HPF: >50 RBC/hpf (ref 0–5)

## 2023-02-26 LAB — HCG, SERUM, QUALITATIVE: Preg, Serum: NEGATIVE

## 2023-02-26 LAB — LIPASE, BLOOD: Lipase: 15 U/L (ref 11–51)

## 2023-02-26 MED ORDER — METRONIDAZOLE 500 MG PO TABS: 500.0000 mg | ORAL_TABLET | Freq: Two times a day (BID) | ORAL | 0 refills | Status: AC

## 2023-02-26 NOTE — ED Provider Notes (Signed)
Lindsey Mora Provider Note   CSN: [id number] Arrival date & time: 02/26/23  1136     History  No chief complaint on file.   Lindsey Mora is a 27 y.o. female who presents to the ED with concerns for vaginal bleeding. She notes that she had a menstrual cycle from 5/21-5/26 and it started back again last night.  Has associated epigastric abdominal pain that has resolved at this time, resolved diarrhea after eating [employer] last night.  No meds tried at home.  Denies vaginal pain, chest pain, shortness of breath, nausea, vomiting, urinary symptoms, constipation, fever.    The history is provided by the patient. No language interpreter was used.       Home Medications Prior to Admission medications   Medication Sig Start Date End Date Taking? Authorizing Provider  metroNIDAZOLE (FLAGYL) 500 MG tablet Take 1 tablet (500 mg total) by mouth 2 (two) times daily. 02/26/23  Yes Liev Brockbank A, PA-C  acetaminophen (TYLENOL) 500 MG tablet Take 1,000 mg by mouth daily as needed for mild pain.    [provider]  amLODipine (NORVASC) 5 MG tablet 5 mg. 09/09/22   [provider]  ibuprofen (ADVIL) 200 MG tablet Take 400 mg by mouth daily as needed for mild pain.    [provider]  losartan (COZAAR) 25 MG tablet 25 mg. 09/10/22   [provider]  magic mouthwash (lidocaine, diphenhydrAMINE, alum & mag hydroxide) suspension Swish and spit 5 mLs 4 (four) times daily as needed for mouth pain. 02/24/22   Mardella Layman, MD  Multiple Vitamins-Minerals (EMERGEN-C VITAMIN C PO) Take 1 packet by mouth daily.    [provider]  naproxen (NAPROSYN) 375 MG tablet Take 1 tablet (375 mg total) by mouth 2 (two) times daily. 10/02/22   Radford Pax, NP  omeprazole (PRILOSEC) 20 MG capsule Take 1 capsule (20 mg total) by mouth daily. 04/18/22   Tomi Bamberger, PA-C  predniSONE (STERAPRED UNI-PAK 21 TAB) 10 MG (21) TBPK  tablet Take following package directions 02/26/22   Waldon Merl, PA-C  promethazine-dextromethorphan (PROMETHAZINE-DM) 6.25-15 MG/5ML syrup Take 5 mLs by mouth 4 (four) times daily as needed for cough. 02/24/22   Mardella Layman, MD      Allergies    Patient has no known allergies.    Review of Systems   Review of Systems  Constitutional:  Negative for fever.  Respiratory:  Negative for shortness of breath.   Cardiovascular:  Negative for chest pain.  Gastrointestinal:  Positive for abdominal pain and diarrhea (resolved). Negative for constipation, nausea and vomiting.  Genitourinary:  Positive for menstrual problem and vaginal bleeding. Negative for dysuria, hematuria and vaginal discharge.  All other systems reviewed and are negative.   Physical Exam Updated Vital Signs BP 127/87 (BP Location: Left Arm)   Pulse 86   Temp 98.5 F (36.9 C) (Oral)   Resp 17   LMP 02/19/2023 (Exact Date)   SpO2 100%  Physical Exam Vitals and nursing note reviewed. Exam conducted with a chaperone present.  Constitutional:      General: She is not in acute distress.    Appearance: Normal appearance. She is not ill-appearing, toxic-appearing or diaphoretic.  HENT:     Head: Normocephalic and atraumatic.     Right Ear: External ear normal.     Left Ear: External ear normal.  Eyes:     General: No scleral icterus.    Extraocular  Movements: Extraocular movements intact.  Cardiovascular:     Rate and Rhythm: Normal rate and regular rhythm.     Pulses: Normal pulses.     Heart sounds: Normal heart sounds.  Pulmonary:     Effort: Pulmonary effort is normal. No respiratory distress.     Breath sounds: Normal breath sounds.  Abdominal:     General: Abdomen is flat. Bowel sounds are normal. There is no distension.     Palpations: Abdomen is soft. There is no mass.     Tenderness: There is no abdominal tenderness.     Hernia: There is no hernia in the left inguinal area or right inguinal area.   Genitourinary:    Pubic Area: No rash.      Labia:        Right: No rash, tenderness, lesion or injury.        Left: No rash, tenderness, lesion or injury.      Vagina: No signs of injury and foreign body. Vaginal discharge and bleeding present. No erythema or tenderness.     Cervix: Normal.     Uterus: Normal. Not deviated, not enlarged, not fixed and not tender.      Adnexa: Right adnexa normal and left adnexa normal.     Comments: NT chaperone present for exam. Vaginal bleeding noted on exam. No appreciable clots noted on vaginal exam.  Musculoskeletal:        General: Normal range of motion.     Cervical back: Normal range of motion and neck supple.  Lymphadenopathy:     Lower Body: No right inguinal adenopathy. No left inguinal adenopathy.  Skin:    General: Skin is warm and dry.  Neurological:     Mental Status: She is alert.     ED Results / Procedures / Treatments   Labs (all labs ordered are listed, but only abnormal results are displayed) Labs Reviewed  WET PREP, GENITAL - Abnormal; Notable for the following components:      Result Value   Clue Cells Wet Prep HPF POC PRESENT (*)    All other components within normal limits  CBC WITH DIFFERENTIAL/PLATELET - Abnormal; Notable for the following components:   Hemoglobin 11.3 (*)    MCH 25.5 (*)    All other components within normal limits  COMPREHENSIVE METABOLIC PANEL - Abnormal; Notable for the following components:   Total Protein 8.2 (*)    All other components within normal limits  URINALYSIS, ROUTINE W REFLEX MICROSCOPIC - Abnormal; Notable for the following components:   Color, Urine RED (*)    APPearance HAZY (*)    Glucose, UA   (*)    Value: TEST NOT REPORTED DUE TO COLOR INTERFERENCE OF URINE PIGMENT   Hgb urine dipstick   (*)    Value: TEST NOT REPORTED DUE TO COLOR INTERFERENCE OF URINE PIGMENT   Bilirubin Urine   (*)    Value: TEST NOT REPORTED DUE TO COLOR INTERFERENCE OF URINE PIGMENT   Ketones,  ur   (*)    Value: TEST NOT REPORTED DUE TO COLOR INTERFERENCE OF URINE PIGMENT   Protein, ur   (*)    Value: TEST NOT REPORTED DUE TO COLOR INTERFERENCE OF URINE PIGMENT   All other components within normal limits  URINALYSIS, MICROSCOPIC (REFLEX) - Abnormal; Notable for the following components:   Bacteria, UA RARE (*)    All other components within normal limits  LIPASE, BLOOD  HCG, SERUM, QUALITATIVE    EKG  None  Radiology US PELVIC COMPLETE WITH TRANSVAGINAL  Result Date: 02/26/2023 CLINICAL DATA:  Vaginal bleeding for 1 day. EXAM: TRANSABDOMINAL AND TRANSVAGINAL ULTRASOUND OF PELVIS TECHNIQUE: Both transabdominal and transvaginal ultrasound examinations of the pelvis were performed. Transabdominal technique was performed for global imaging of the pelvis including uterus, ovaries, adnexal regions, and pelvic cul-de-sac. It was necessary to proceed with endovaginal exam following the transabdominal exam to visualize the endometrium. COMPARISON:  None Available. FINDINGS: Uterus Measurements: 6.6 x 3.2 x 3.7 cm = volume: 41.4 mL. No fibroids or other mass visualized. Endometrium Thickness: 4 mm, within normal limits. No focal abnormality visualized. Right ovary Not visualized Left ovary Measurements: 2.9 x 2.9 x 2.1 cm = volume: 9.3 mL. Normal appearance/no adnexal mass. Other findings No abnormal free fluid. IMPRESSION: 1. Normal appearance of the uterus and left ovary. 2. Nonvisualization of the right ovary. Electronically Signed   By: Marin Roberts M.D.   On: 02/26/2023 17:17    Procedures Procedures    Medications Ordered in ED Medications - No data to display  ED Course/ Medical Decision Making/ A&P Clinical Course as of 02/26/23 1736  Fri Feb 26, 2023  1413 Clue Cells Wet Prep HPF POC(!): PRESENT [SB]  1459 Notified by RN that patient declines [place] at this time.  [SB]  1508 Again discussed with patient reasons for obtaining ultrasound today, patient declines at this  time.  Discussed with patient we will send her with a prescription for Flagyl as well as information  for OB/GYN follow-up. [SB]  1516 Pt re-evaluated and noted that she would like to proceed with the [place] at this time.  [SB]  1731 Re-evaluated and discussed with patient ultrasound findings. Discussed discharge treatment plan. Pt agreeable at this time. Pt appears safe for discharge. [SB]    Clinical Course User Index [SB] Laquitha Heslin A, PA-C                             Medical Decision Making Amount and/or Complexity of Data Reviewed Labs: ordered. Decision-making details documented in ED Course. Radiology: ordered.  Risk Prescription drug management.   Patient is a No obstetric history on file. presents to the ED with concerns for vaginal bleeding onset last night. Notes that her last menstrual cycle started on 5/21-5/26 and started again last night. Doesn't have a PCP at this time. Not currently on OCPs. Notes typically having irregular cycles.  Pt afebrile. On exam pt with no abdominal TTP. NT chaperone present for GU exam, notable for vaginal bleeding without clots noted.  Otherwise no acute cardiovascular, respiratory exam findings.  Differential diagnosis includes ectopic pregnancy, miscarriage, abnormal uterine bleeding, implantation bleeding.     Labs:  I ordered, and personally interpreted labs.  The pertinent results include:   hCG negative.  CBC unremarkable CMP unremarkable Urinalysis red (patient currently on menstrual cycle)   Imaging: I ordered imaging studies including US pelvic complete with transvaginal   I independently visualized and interpreted imaging which showed:  1. Normal appearance of the uterus and left ovary.  2. Nonvisualization of the right ovary.   I agree with the radiologist interpretation  Disposition: Presentation suspicious for vaginal bleeding, likely AUB as cause. Doubt concerns at this time for ectopic pregnancy. No concerns at this time  for ovarian torsion, patient without abdominal pain at this time. Doubt concerns at this time for implantation bleeding or miscarriage. After consideration of the diagnostic results and the  patients response to treatment, I feel that the patient would benefit from Discharge home. Discussed lab and imaging findings with patient at bedside. Answered all questions at bedside. Pt provided with information for OBGYN for follow up regarding ED visit. Work note provided. Flagyl Rx sent to pharmacy. Pt declined testing today for STIs, patient hasn't been sexually active for 4 years. Patient agreeable to discharge treatment plan.  Supportive care measures and strict return precautions discussed with patient at bedside.  Patient appears safe for discharge at this time.  Follow-up as indicated in discharge paperwork.   This chart was dictated using voice recognition software, Dragon. Despite the best efforts of this provider to proofread and correct errors, errors may still occur which can change documentation meaning.  Final Clinical Impression(s) / ED Diagnoses Final diagnoses:  Abnormal uterine bleeding (AUB)  BV (bacterial vaginosis)    Rx / DC Orders ED Discharge Orders          Ordered    metroNIDAZOLE (FLAGYL) 500 MG tablet  2 times daily        02/26/23 1731              Idali Lafever A, PA-C 02/26/23 1736    Gwyneth Sprout, MD 02/27/23 2049

## 2023-02-26 NOTE — Discharge Instructions (Addendum)
It was a pleasure taking care of you today!   Your labs and ultrasound didn't show any concerning findings today. Your swab today showed concern for BV, you will be sent a prescription for flagyl, take as directed and ensure to complete the entire course of the antibiotic. Attached is information OB-GYN, you may call and set up a follow up appointment regarding your vaginal bleeding. You may also follow up with your primary care provider as well. Return to the ED if you have increasing/worsening abdominal pain, vaginal bleeding, fever, or worsening symptoms.

## 2023-02-26 NOTE — Discharge Instructions (Signed)
Please go to the emergency department as soon as you leave urgent care for further evaluation and management. ?

## 2023-02-26 NOTE — ED Notes (Signed)
Patient is being discharged from the Urgent Care and sent to the Emergency Department via private vehicle . Per Laren Everts NP, patient is in need of higher level of care due to abdominal pain and vaginal bleeding. Patient is aware and verbalizes understanding of plan of care.  Vitals:   02/26/23 0939  BP: 127/88  Pulse: 83  Resp: 16  Temp: 98.1 F (36.7 C)  SpO2: 98%

## 2023-02-26 NOTE — ED Provider Notes (Signed)
[facility]    CSN: [id number] Arrival date & time: 02/26/23  0825      History   Chief Complaint Chief Complaint  Patient presents with   Vaginal Bleeding    HPI Lindsey Mora is a 27 y.o. female.   Patient presents with abdominal pain and vaginal bleeding that started over the past 24 hours.  Reports the vaginal bleeding started last night and is light bleeding.  She states that she woke up this morning with epigastric abdominal pain that is rated 3-4 on pain scale but patient is not able to characterize pain.  Patient reports that she had her menstrual cycle last week which lasted about 5 days and completed.  She states that this is new onset vaginal bleeding.  She typically has monthly menstrual cycles with no breakthrough bleeding.  She does not use any form of birth control.  Denies dysuria, hematuria, urinary frequency, vaginal discharge, lower abdominal pain, fever.  Reports that she has not been sexually active in 4 years so denies concern for pregnancy or STD.  Denies nausea, vomiting, diarrhea.  Last bowel movement was yesterday with no blood in stool.   Vaginal Bleeding   Past Medical History:  Diagnosis Date   Hypertension     There are no problems to display for this patient.   History reviewed. No pertinent surgical history.  OB History   No obstetric history on file.      Home Medications    Prior to Admission medications   Medication Sig Start Date End Date Taking? Authorizing Provider  acetaminophen (TYLENOL) 500 MG tablet Take 1,000 mg by mouth daily as needed for mild pain.    [provider]  amLODipine (NORVASC) 5 MG tablet 5 mg. 09/09/22   [provider]  ibuprofen (ADVIL) 200 MG tablet Take 400 mg by mouth daily as needed for mild pain.    [provider]  losartan (COZAAR) 25 MG tablet 25 mg. 09/10/22   [provider]  magic mouthwash (lidocaine, diphenhydrAMINE, alum & mag hydroxide)  suspension Swish and spit 5 mLs 4 (four) times daily as needed for mouth pain. 02/24/22   Mardella Layman, MD  Multiple Vitamins-Minerals (EMERGEN-C VITAMIN C PO) Take 1 packet by mouth daily.    [provider]  naproxen (NAPROSYN) 375 MG tablet Take 1 tablet (375 mg total) by mouth 2 (two) times daily. 10/02/22   Radford Pax, NP  omeprazole (PRILOSEC) 20 MG capsule Take 1 capsule (20 mg total) by mouth daily. 04/18/22   Tomi Bamberger, PA-C  predniSONE (STERAPRED UNI-PAK 21 TAB) 10 MG (21) TBPK tablet Take following package directions 02/26/22   Waldon Merl, PA-C  promethazine-dextromethorphan (PROMETHAZINE-DM) 6.25-15 MG/5ML syrup Take 5 mLs by mouth 4 (four) times daily as needed for cough. 02/24/22   Mardella Layman, MD    Family History History reviewed. No pertinent family history.  Social History Social History   Tobacco Use   Smoking status: Never  Substance Use Topics   Alcohol use: No   Drug use: No     Allergies   Patient has no known allergies.   Review of Systems Review of Systems Per HPI  Physical Exam Triage Vital Signs ED Triage Vitals  Enc Vitals Group     BP 02/26/23 0939 127/88     Pulse Rate 02/26/23 0939 83     Resp 02/26/23 0939 16     Temp 02/26/23 0939 98.1 F (36.7 C)  Temp Source 02/26/23 0939 Oral     SpO2 02/26/23 0939 98 %     Weight --      Height --      Head Circumference --      Peak Flow --      Pain Score 02/26/23 0941 0     Pain Loc --      Pain Edu? --      Excl. in GC? --    No data found.  Updated Vital Signs BP 127/88 (BP Location: Left Arm)   Pulse 83   Temp 98.1 F (36.7 C) (Oral)   Resp 16   LMP 02/19/2023 (Exact Date)   SpO2 98%   Visual Acuity Right Eye Distance:   Left Eye Distance:   Bilateral Distance:    Right Eye Near:   Left Eye Near:    Bilateral Near:     Physical Exam Constitutional:      General: She is not in acute distress.    Appearance: Normal appearance. She is not  toxic-appearing or diaphoretic.  HENT:     Head: Normocephalic and atraumatic.  Eyes:     Extraocular Movements: Extraocular movements intact.     Conjunctiva/sclera: Conjunctivae normal.  Cardiovascular:     Rate and Rhythm: Normal rate and regular rhythm.     Pulses: Normal pulses.     Heart sounds: Normal heart sounds.  Pulmonary:     Effort: Pulmonary effort is normal. No respiratory distress.     Breath sounds: Normal breath sounds.  Abdominal:     General: Bowel sounds are normal.     Palpations: Abdomen is soft.     Tenderness: There is no abdominal tenderness.       Comments: No tenderness to palpation of abdomen.  Patient reports pain is present in the epigastric area.  Neurological:     General: No focal deficit present.     Mental Status: She is alert and oriented to person, place, and time. Mental status is at baseline.  Psychiatric:        Mood and Affect: Mood normal.        Behavior: Behavior normal.        Thought Content: Thought content normal.        Judgment: Judgment normal.      UC Treatments / Results  Labs (all labs ordered are listed, but only abnormal results are displayed) Labs Reviewed - No data to display  EKG   Radiology No results found.  Procedures Procedures (including critical care time)  Medications Ordered in UC Medications - No data to display  Initial Impression / Assessment and Plan / UC Course  I have reviewed the triage vital signs and the nursing notes.  Pertinent labs & imaging results that were available during my care of the patient were reviewed by me and considered in my medical decision making (see chart for details).     Discussed with patient limitations of evaluation for current symptoms in urgent care.  Advised her that epigastric pain and vaginal bleeding may or may not be related but it does require further workup.  Advised patient that imaging of the abdomen and stat blood work may be necessary to rule out  worrisome etiology which has to be completed at the ER.  Patient was agreeable to going to the ER today.  Given vital signs are stable, agree with patient self transport to the ER. Final Clinical Impressions(s) / UC Diagnoses   Final  diagnoses:  Abdominal pain, epigastric  Vaginal bleeding     Discharge Instructions      Please go to the emergency department as soon as you leave urgent care for further evaluation and management.    ED Prescriptions   None    PDMP not reviewed this encounter.   Gustavus Bryant, [place] 02/26/23 1027

## 2023-02-26 NOTE — ED Notes (Signed)
Patient transported to US 

## 2023-02-26 NOTE — ED Triage Notes (Signed)
Pt states she had her period last week for 5 days and then this week she started having vaginal bleeding again last night.  Pt denies any birth control.

## 2023-02-26 NOTE — ED Triage Notes (Signed)
Pt sent over from UC for vaginal bleeding , no abd pain n/v noted ,

## 2023-09-16 ENCOUNTER — Ambulatory Visit (HOSPITAL_BASED_OUTPATIENT_CLINIC_OR_DEPARTMENT_OTHER)
Admission: RE | Admit: 2023-09-16 | Discharge: 2023-09-16 | Disposition: A | Discharge status: Home / Self Care | Payer: BLUE CROSS/BLUE SHIELD | Source: Ambulatory Visit | Attending: Family Medicine | Admitting: Family Medicine

## 2023-09-16 ENCOUNTER — Ambulatory Visit
Admission: EM | Admit: 2023-09-16 | Discharge: 2023-09-16 | Disposition: A | Discharge status: Home / Self Care | Payer: BLUE CROSS/BLUE SHIELD | Attending: Internal Medicine | Admitting: Internal Medicine

## 2023-09-16 DIAGNOSIS — R051 Acute cough: Secondary | ICD-10-CM | POA: Insufficient documentation

## 2023-09-16 HISTORY — DX: Gastro-esophageal reflux disease without esophagitis: K21.9

## 2023-09-16 NOTE — Discharge Instructions (Signed)
I will contact you with results of the x-ray if positive.  Continue the Augmentin and cough medicine prescribed to you previously.  You may also take ibuprofen as needed for chest wall pain.  Please follow-up with your PCP in 2 days for recheck.  Please go to the ER for any worsening symptoms.  I hope you feel better soon!

## 2023-09-16 NOTE — ED Triage Notes (Signed)
Pt presents to UC for c/o productive cough for about 9 days. Pt reports she has developed pain with coughing and taking deep breaths. She denies shortness of breath. She states she has been on augmentin for 2 days prescribed from a video visit.

## 2023-09-16 NOTE — ED Provider Notes (Signed)
[facility]    CSN: [id number] Arrival date & time: 09/16/23  1700      History   Chief Complaint No chief complaint on file.   HPI Lindsey Mora is a 27 y.o. female  presents for evaluation of URI symptoms for 9 days. Patient reports associated symptoms of productive cough with chest wall pain with coughing and deep breathing. Denies N/V/D, fever, ear pain, sore throat, body aches, shortness of breath. Patient does not have a hx of asthma. Patient does  have a previous history of smoking.  Reports she did a virtual visit 2 days ago and was prescribed Augmentin and benzonatate.  She is taken 2 doses of the Augmentin and states her cough is better but she still concerned about pneumonia.  Pt has taken cough medicine OTC for symptoms. Pt has no other concerns at this time.   HPI  Past Medical History:  Diagnosis Date   GERD (gastroesophageal reflux disease)    Hypertension     There are no active problems to display for this patient.   History reviewed. No pertinent surgical history.  OB History   No obstetric history on file.      Home Medications    Prior to Admission medications   Medication Sig Start Date End Date Taking? Authorizing Provider  Multiple Vitamins-Minerals (EMERGEN-C VITAMIN C PO) Take 1 packet by mouth daily.   Yes [provider]  acetaminophen (TYLENOL) 500 MG tablet Take 1,000 mg by mouth daily as needed for mild pain.    [provider]  amLODipine (NORVASC) 5 MG tablet 5 mg. 09/09/22   [provider]  ibuprofen (ADVIL) 200 MG tablet Take 400 mg by mouth daily as needed for mild pain.    [provider]  losartan (COZAAR) 25 MG tablet 25 mg. 09/10/22   [provider]  magic mouthwash (lidocaine, diphenhydrAMINE, alum & mag hydroxide) suspension Swish and spit 5 mLs 4 (four) times daily as needed for mouth pain. 02/24/22   Mardella Layman, MD  metroNIDAZOLE (FLAGYL) 500 MG tablet Take 1 tablet  (500 mg total) by mouth 2 (two) times daily. 02/26/23   Blue, Soijett A, PA-C  naproxen (NAPROSYN) 375 MG tablet Take 1 tablet (375 mg total) by mouth 2 (two) times daily. 10/02/22   Radford Pax, NP  omeprazole (PRILOSEC) 20 MG capsule Take 1 capsule (20 mg total) by mouth daily. 04/18/22   Tomi Bamberger, PA-C  predniSONE (STERAPRED UNI-PAK 21 TAB) 10 MG (21) TBPK tablet Take following package directions 02/26/22   Waldon Merl, PA-C  promethazine-dextromethorphan (PROMETHAZINE-DM) 6.25-15 MG/5ML syrup Take 5 mLs by mouth 4 (four) times daily as needed for cough. 02/24/22   Mardella Layman, MD    Family History History reviewed. No pertinent family history.  Social History Social History   Tobacco Use   Smoking status: Never  Vaping Use   Vaping status: Never Used  Substance Use Topics   Alcohol use: No   Drug use: No     Allergies   Patient has no known allergies.   Review of Systems Review of Systems  Respiratory:  Positive for cough.      Physical Exam Triage Vital Signs ED Triage Vitals  Encounter Vitals Group     BP 09/16/23 1712 (!) 142/91     Systolic BP Percentile --      Diastolic BP Percentile --      Pulse Rate 09/16/23 1712 98  Resp 09/16/23 1712 16     Temp 09/16/23 1712 97.7 F (36.5 C)     Temp Source 09/16/23 1712 Oral     SpO2 09/16/23 1712 98 %     Weight --      Height --      Head Circumference --      Peak Flow --      Pain Score 09/16/23 1708 5     Pain Loc --      Pain Education --      Exclude from Growth Chart --    No data found.  Updated Vital Signs BP (!) 142/91 (BP Location: Left Wrist)   Pulse 98   Temp 97.7 F (36.5 C) (Oral)   Resp 16   LMP 08/26/2023 (Approximate)   SpO2 98%   Visual Acuity Right Eye Distance:   Left Eye Distance:   Bilateral Distance:    Right Eye Near:   Left Eye Near:    Bilateral Near:     Physical Exam Vitals and nursing note reviewed.  Constitutional:      General: She is not in  acute distress.    Appearance: She is well-developed. She is not ill-appearing.  HENT:     Head: Normocephalic and atraumatic.     Right Ear: Tympanic membrane and ear canal normal.     Left Ear: Tympanic membrane and ear canal normal.     Nose: No congestion or rhinorrhea.     Mouth/Throat:     Mouth: Mucous membranes are moist.     Pharynx: Oropharynx is clear. Uvula midline. No oropharyngeal exudate or posterior oropharyngeal erythema.     Tonsils: No tonsillar exudate or tonsillar abscesses.  Eyes:     Conjunctiva/sclera: Conjunctivae normal.     Pupils: Pupils are equal, round, and reactive to light.  Cardiovascular:     Rate and Rhythm: Normal rate and regular rhythm.     Heart sounds: Normal heart sounds.  Pulmonary:     Effort: Pulmonary effort is normal.     Breath sounds: Normal breath sounds. No wheezing or rhonchi.     Comments: Chest wall tenderness to left chest with palpation Chest:     Chest wall: Tenderness present.  Musculoskeletal:     Cervical back: Normal range of motion and neck supple.  Lymphadenopathy:     Cervical: No cervical adenopathy.  Skin:    General: Skin is warm and dry.  Neurological:     General: No focal deficit present.     Mental Status: She is alert and oriented to person, place, and time.  Psychiatric:        Mood and Affect: Mood normal.        Behavior: Behavior normal.      UC Treatments / Results  Labs (all labs ordered are listed, but only abnormal results are displayed) Labs Reviewed - No data to display  EKG   Radiology No results found.  Procedures Procedures (including critical care time)  Medications Ordered in UC Medications - No data to display  Initial Impression / Assessment and Plan / UC Course  I have reviewed the triage vital signs and the nursing notes.  Pertinent labs & imaging results that were available during my care of the patient were reviewed by me and considered in my medical decision making  (see chart for details).     I reviewed exam and symptoms with patient.  Vital signs stable and she is well-appearing.  Will  have patient get x-ray, no x-ray available onsite at time of evaluation.  Will contact patient for any positive results once radiology overread available.  She should continue the Augmentin and benzonatate previously prescribed.  PCP follow-up 2 days for recheck.  ER precautions reviewed. Final Clinical Impressions(s) / UC Diagnoses   Final diagnoses:  Acute cough     Discharge Instructions      I will contact you with results of the x-ray if positive.  Continue the Augmentin and cough medicine prescribed to you previously.  You may also take ibuprofen as needed for chest wall pain.  Please follow-up with your PCP in 2 days for recheck.  Please go to the ER for any worsening symptoms.  I hope you feel better soon!    ED Prescriptions   None    PDMP not reviewed this encounter.   Radford Pax, NP 09/16/23 1739

## 2023-09-17 ENCOUNTER — Telehealth: Payer: Self-pay
